# Patient Record
Sex: Male | Born: 1975 | Hispanic: Yes | Smoking: Current every day smoker
Health system: Southern US, Community
[De-identification: ages and names within clinical notes are randomized; demographics above are authoritative.]

## PROBLEM LIST (undated history)

## (undated) HISTORY — PX: ANKLE SURGERY: SHX546

---

## 2010-08-26 ENCOUNTER — Emergency Department: Payer: Self-pay | Admitting: Internal Medicine

## 2010-09-22 ENCOUNTER — Emergency Department: Payer: Self-pay | Admitting: Emergency Medicine

## 2010-12-27 ENCOUNTER — Emergency Department: Payer: Self-pay | Admitting: Emergency Medicine

## 2012-12-13 ENCOUNTER — Emergency Department: Payer: Self-pay | Admitting: Emergency Medicine

## 2013-05-29 ENCOUNTER — Emergency Department: Payer: Self-pay | Admitting: Emergency Medicine

## 2015-02-14 ENCOUNTER — Emergency Department
Admission: EM | Admit: 2015-02-14 | Discharge: 2015-02-14 | Disposition: A | Discharge status: Home / Self Care | Payer: Self-pay | Attending: Student | Admitting: Student

## 2015-02-14 ENCOUNTER — Encounter: Payer: Self-pay | Admitting: Emergency Medicine

## 2015-02-14 DIAGNOSIS — G8929 Other chronic pain: Secondary | ICD-10-CM | POA: Insufficient documentation

## 2015-02-14 DIAGNOSIS — K0889 Other specified disorders of teeth and supporting structures: Secondary | ICD-10-CM

## 2015-02-14 DIAGNOSIS — M25571 Pain in right ankle and joints of right foot: Secondary | ICD-10-CM | POA: Insufficient documentation

## 2015-02-14 DIAGNOSIS — Z720 Tobacco use: Secondary | ICD-10-CM | POA: Insufficient documentation

## 2015-02-14 DIAGNOSIS — K088 Other specified disorders of teeth and supporting structures: Secondary | ICD-10-CM | POA: Insufficient documentation

## 2015-02-14 MED ORDER — TRAMADOL HCL 50 MG PO TABS: 50.0000 mg | ORAL_TABLET | Freq: Four times a day (QID) | ORAL | Status: DC | PRN

## 2015-02-14 MED ORDER — TRAMADOL HCL 50 MG PO TABS
50.0000 mg | ORAL_TABLET | Freq: Once | ORAL | Status: AC
  Administered 2015-02-14: 50 mg via ORAL
  Filled 2015-02-14: qty 1

## 2015-02-14 MED ORDER — AMOXICILLIN 500 MG PO TABS: 500.0000 mg | ORAL_TABLET | Freq: Three times a day (TID) | ORAL | Status: DC

## 2015-02-14 MED ORDER — AMOXICILLIN 500 MG PO CAPS
500.0000 mg | ORAL_CAPSULE | Freq: Once | ORAL | Status: AC
Start: 2015-02-14 — End: 2015-02-14
  Administered 2015-02-14: 500 mg via ORAL
  Filled 2015-02-14: qty 1

## 2015-02-14 MED ORDER — MELOXICAM 15 MG PO TABS: 15.0000 mg | ORAL_TABLET | Freq: Every day | ORAL | Status: DC

## 2015-02-14 NOTE — ED Provider Notes (Signed)
[facility] Emergency Department Provider Note ____________________________________________  Time seen: Approximately 2:04 PM  I have reviewed the triage vital signs and the nursing notes.   HISTORY  Chief Complaint Dental Pain   HPI Erron Wengert is a 39 y.o. male who presents to the emergency department for evaluation of dental pain. Pain has been gradually increasing over the past week. He has been taking ibuprofen without any relief. He is also complaining of chronic right ankle pain that seems to be worsening. He fractured his right ankle several years ago that resulted in reconstructive surgery. He states that when he works long hours and several days in a row ankle is very painful and swollen. He denies new injury.  History reviewed. No pertinent past medical history.  There are no active problems to display for this patient.   Past Surgical History  Procedure Laterality Date  . Ankle surgery Right     Current Outpatient Rx  Name  Route  Sig  Dispense  Refill  . amoxicillin (AMOXIL) 500 MG tablet   Oral   Take 1 tablet (500 mg total) by mouth 3 (three) times daily.   30 tablet   0   . meloxicam (MOBIC) 15 MG tablet   Oral   Take 1 tablet (15 mg total) by mouth daily.   30 tablet   2   . traMADol (ULTRAM) 50 MG tablet   Oral   Take 1 tablet (50 mg total) by mouth every 6 (six) hours as needed.   12 tablet   0     Allergies Review of patient's allergies indicates no known allergies.  No family history on file.  Social History Social History  Substance Use Topics  . Smoking status: Current Every Day Smoker -- 0.50 packs/day    Types: Cigarettes  . Smokeless tobacco: None  . Alcohol Use: None    Review of Systems Constitutional: No fever/chills Eyes: No visual changes. ENT: No sore throat. Cardiovascular: Denies chest pain. Respiratory: Denies shortness of breath. Gastrointestinal: No abdominal pain.  No nausea, no  vomiting.  Genitourinary: Negative for dysuria. Musculoskeletal: Positive for right ankle pain Skin: Negative for rash. Neurological: Negative for headaches, focal weakness or numbness. 10-point ROS otherwise negative.  ____________________________________________   PHYSICAL EXAM:  VITAL SIGNS: ED Triage Vitals  Enc Vitals Group     BP 02/14/15 1326 129/75 mmHg     Pulse Rate 02/14/15 1326 83     Resp 02/14/15 1326 18     Temp 02/14/15 1326 98.6 F (37 C)     Temp Source 02/14/15 1326 Oral     SpO2 02/14/15 1326 98 %     Weight 02/14/15 1326 205 lb (92.987 kg)     Height 02/14/15 1326 6' (1.829 m)     Head Cir --      Peak Flow --      Pain Score 02/14/15 1326 9     Pain Loc --      Pain Edu? --      Excl. in GC? --     Constitutional: Alert and oriented. Well appearing and in no acute distress. Eyes: Conjunctivae are normal. PERRL. EOMI. Head: Atraumatic. Nose: No congestion/rhinnorhea. Mouth/Throat: Mucous membranes are moist.  Oropharynx non-erythematous. Periodontal Exam    Neck: No stridor.  Hematological/Lymphatic/Immunilogical: No cervical lymphadenopathy. Cardiovascular:   Good peripheral circulation. Respiratory: Normal respiratory effort.  No retractions. Musculoskeletal: No lower extremity tenderness nor edema.  No joint effusions. Neurologic:  Normal  speech and language. No gross focal neurologic deficits are appreciated. Speech is normal. No gait instability. Skin:  Skin is warm, dry and intact. No rash noted. Psychiatric: Mood and affect are normal. Speech and behavior are normal.  ____________________________________________   LABS (all labs ordered are listed, but only abnormal results are displayed)  Labs Reviewed - No data to display ____________________________________________   RADIOLOGY  Not indicated ____________________________________________   PROCEDURES  Procedure(s) performed: None  Critical Care performed:  No  ____________________________________________   INITIAL IMPRESSION / ASSESSMENT AND PLAN / ED COURSE  Pertinent labs & imaging results that were available during my care of the patient were reviewed by me and considered in my medical decision making (see chart for details).  Patient was advised to see the dentist within 14 days. Also advised to take the antibiotic until finished. Instructed to return to the ER for symptoms that change or worsen if you are unable to schedule an appointment.  He is also advised to follow-up with the orthopedic doctor for chronic pain. ____________________________________________   FINAL CLINICAL IMPRESSION(S) / ED DIAGNOSES  Final diagnoses:  Pain, dental  Chronic ankle pain, right       Chinita Pester, FNP 02/14/15 1410  Gayla Doss, MD 02/14/15 1529
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
## 2015-02-14 NOTE — Discharge Instructions (Signed)
OPTIONS FOR DENTAL FOLLOW UP CARE ° °[place] Department of Health and Human Services - Local Safety Net Dental Clinics °http://www.ncdhhs.gov/dph/oralhealth/services/safetynetclinics.htm °  °[facility] (336-562-3123) ° °[facility] [place] (919-933-9087) ° °[facility] (919-663-1744 ext [phone]) ° °[facility] ([phone]) ° °[facility] ([phone]) °This clinic caters to the indigent population and is on a lottery system. °Location: °[facility], [address], [place] °Clinic Hours: °Wednesdays from 6pm - 9pm, patients seen by a lottery system. °For dates, call or go to www.med.[facility].edu/[facility]/patients/Dental-[facility] °Services: °Cleanings, fillings and simple extractions. °Payment Options: °DENTAL WORK IS FREE OF CHARGE. Bring proof of income or support. °Best way to get seen: °Arrive at 5:15 pm - this is a lottery, NOT first come/first serve, so arriving earlier will not increase your chances of being seen. °  °  °[facility] °[phone] °Select option 1 for emergencies °  °Location: °[facility], [address], [place] °Clinic Hours: °No walk-ins accepted - call the day before to schedule an appointment. °Check in times are 9:30 am and 1:30 pm. °Services: °Simple extractions, temporary fillings, pulpectomy/pulp debridement, uncomplicated abscess drainage. °Payment Options: °PAYMENT IS DUE AT THE TIME OF SERVICE.  Fee is usually $100-200, additional surgical procedures (e.g. abscess drainage) may be extra. °Cash, checks, Visa/MasterCard accepted.  Can file Medicaid if patient is covered for dental - patient should call case worker to check. °No discount for [facility] patients. °Best way to get seen: °MUST call the day before and get onto the schedule. Can usually be seen the next 1-2 days. No walk-ins accepted. °  °  °[facility] °[phone] °   °Location: °[facility], [address], [place] °Clinic Hours: °M, W, Th, F 8am or 1:30pm, Tues 9a or 1:30 - first come/first served. °Services: °Simple extractions, temporary fillings, uncomplicated abscess drainage.  You do not need to be an [place] resident. °Payment Options: °PAYMENT IS DUE AT THE TIME OF SERVICE. °Dental insurance, otherwise sliding scale - bring proof of income or support. °Depending on income and treatment needed, cost is usually $50-200. °Best way to get seen: °Arrive early as it is first come/first served. °  °  °[facility] °[phone] °  °Location: °[address] °Clinic Hours: °Mon-Thu 8a-5p °Services: °Most basic dental services including extractions and fillings. °Payment Options: °PAYMENT IS DUE AT THE TIME OF SERVICE. °Sliding scale, up to 50% off - bring proof if income or support. °Medicaid with dental option accepted. °Best way to get seen: °Call to schedule an appointment, can usually be seen within 2 weeks OR they will try to see walk-ins - show up at 8a or 2p (you may have to wait). °  °  °[facility] °919-245-2435 °[place] RESIDENTS ONLY °  °Location: °[facility], [address], [place], [place] [address] °Clinic Hours: By appointment only. °Monday - Thursday 8am-5pm, Friday 8am-12pm °Services: Cleanings, fillings, extractions. °Payment Options: °PAYMENT IS DUE AT THE TIME OF SERVICE. °Cash, Visa or MasterCard. Sliding scale - $30 minimum per service. °Best way to get seen: °Come in to office, complete packet and make an appointment - need proof of income °or support monies for each household member and proof of [place] residence. °Usually takes about a month to get in. °  °  °[facility] °[phone] °  °Location: °[address]   [place] °Clinic Hours: Walk-in Urgent Care Dental Services are offered Monday-Friday  mornings only. °The numbers of emergencies accepted daily is limited to the number of °providers available. °Maximum 15 - Mondays, Wednesdays & Thursdays °Maximum 10 - Tuesdays & Fridays °Services: °You do not need to be a [place] resident to be seen for a dental emergency. °Emergencies are defined as pain, swelling, abnormal bleeding, or dental trauma. Walkins will receive x-rays if needed. °NOTE: Dental cleaning is not an emergency. °Payment Options: °PAYMENT IS DUE AT THE TIME OF SERVICE. °Minimum co-pay is $40.00 for uninsured patients. °Minimum co-pay is $3.00 for Medicaid with dental coverage. °Dental Insurance is accepted and must be presented at time of visit. °Medicare does not cover dental. °Forms of payment: Cash, credit card, checks. °Best way to get seen: °If not previously registered with the clinic, walk-in dental registration begins at 7:15 am and is on a first come/first serve basis. °If previously registered with the clinic, call to make an appointment. °  °  °[facility] °[phone] °[place] RESIDENTS ONLY °  °Location: °[address], [place], [place] °Clinic Hours: °Mon-Thu 10a-2p °Services: Extractions only! °Payment Options: °FREE (donations accepted) - bring proof of income or support °Best way to get seen: °Call and schedule an appointment OR come at 8am on the 1st Monday of every month (except for holidays) when it is first come/first served. °  °  °[facility] °[phone] °  °Location: °[address], [place] °Clinic Hours: °Friday mornings °Services, Payment Options, Best way to get seen: °Call for info °

## 2019-02-19 ENCOUNTER — Other Ambulatory Visit: Payer: Self-pay

## 2019-02-19 DIAGNOSIS — Z20822 Contact with and (suspected) exposure to covid-19: Secondary | ICD-10-CM

## 2019-02-20 LAB — NOVEL CORONAVIRUS, NAA: SARS-CoV-2, NAA: NOT DETECTED

## 2020-09-02 ENCOUNTER — Emergency Department: Payer: Self-pay

## 2020-09-02 ENCOUNTER — Other Ambulatory Visit: Payer: Self-pay

## 2020-09-02 ENCOUNTER — Emergency Department
Admission: EM | Admit: 2020-09-02 | Discharge: 2020-09-02 | Disposition: A | Discharge status: Home / Self Care | Payer: Self-pay | Attending: Emergency Medicine | Admitting: Emergency Medicine

## 2020-09-02 DIAGNOSIS — F17210 Nicotine dependence, cigarettes, uncomplicated: Secondary | ICD-10-CM | POA: Insufficient documentation

## 2020-09-02 DIAGNOSIS — S0240FA Zygomatic fracture, left side, initial encounter for closed fracture: Secondary | ICD-10-CM | POA: Insufficient documentation

## 2020-09-02 DIAGNOSIS — F419 Anxiety disorder, unspecified: Secondary | ICD-10-CM | POA: Insufficient documentation

## 2020-09-02 DIAGNOSIS — R519 Headache, unspecified: Secondary | ICD-10-CM | POA: Insufficient documentation

## 2020-09-02 DIAGNOSIS — R0602 Shortness of breath: Secondary | ICD-10-CM | POA: Insufficient documentation

## 2020-09-02 DIAGNOSIS — Z79899 Other long term (current) drug therapy: Secondary | ICD-10-CM | POA: Insufficient documentation

## 2020-09-02 DIAGNOSIS — R197 Diarrhea, unspecified: Secondary | ICD-10-CM | POA: Insufficient documentation

## 2020-09-02 DIAGNOSIS — Z20822 Contact with and (suspected) exposure to covid-19: Secondary | ICD-10-CM | POA: Insufficient documentation

## 2020-09-02 LAB — CBC WITH DIFFERENTIAL/PLATELET
Abs Immature Granulocytes: 0.05 10*3/uL (ref 0.00–0.07)
Basophils Absolute: 0.1 10*3/uL (ref 0.0–0.1)
Basophils Relative: 1 %
Eosinophils Absolute: 0.5 10*3/uL (ref 0.0–0.5)
Eosinophils Relative: 4 %
HCT: 40 % (ref 39.0–52.0)
Hemoglobin: 13.7 g/dL (ref 13.0–17.0)
Immature Granulocytes: 1 %
Lymphocytes Relative: 38 %
Lymphs Abs: 4.2 10*3/uL — ABNORMAL HIGH (ref 0.7–4.0)
MCH: 32 pg (ref 26.0–34.0)
MCHC: 34.3 g/dL (ref 30.0–36.0)
MCV: 93.5 fL (ref 80.0–100.0)
Monocytes Absolute: 0.8 10*3/uL (ref 0.1–1.0)
Monocytes Relative: 8 %
Neutro Abs: 5.2 10*3/uL (ref 1.7–7.7)
Neutrophils Relative %: 48 %
Platelets: 228 10*3/uL (ref 150–400)
RBC: 4.28 MIL/uL (ref 4.22–5.81)
RDW: 13.3 % (ref 11.5–15.5)
Smear Review: NORMAL
WBC: 10.8 10*3/uL — ABNORMAL HIGH (ref 4.0–10.5)
nRBC: 0 % (ref 0.0–0.2)

## 2020-09-02 LAB — COMPREHENSIVE METABOLIC PANEL
ALT: 80 U/L — ABNORMAL HIGH (ref 0–44)
AST: 55 U/L — ABNORMAL HIGH (ref 15–41)
Albumin: 4.3 g/dL (ref 3.5–5.0)
Alkaline Phosphatase: 55 U/L (ref 38–126)
Anion gap: 9 (ref 5–15)
BUN: 18 mg/dL (ref 6–20)
CO2: 22 mmol/L (ref 22–32)
Calcium: 8.9 mg/dL (ref 8.9–10.3)
Chloride: 106 mmol/L (ref 98–111)
Creatinine, Ser: 0.93 mg/dL (ref 0.61–1.24)
GFR, Estimated: >60 mL/min (ref >60)
Glucose, Bld: 116 mg/dL — ABNORMAL HIGH (ref 70–99)
Potassium: 3.8 mmol/L (ref 3.5–5.1)
Sodium: 137 mmol/L (ref 135–145)
Total Bilirubin: 0.8 mg/dL (ref 0.3–1.2)
Total Protein: 7.5 g/dL (ref 6.5–8.1)

## 2020-09-02 LAB — RESP PANEL BY RT-PCR (FLU A&B, COVID) ARPGX2
Influenza A by PCR: NEGATIVE
Influenza B by PCR: NEGATIVE
SARS Coronavirus 2 by RT PCR: NEGATIVE

## 2020-09-02 LAB — TROPONIN I (HIGH SENSITIVITY): Troponin I (High Sensitivity): 4 ng/L (ref <18)

## 2020-09-02 MED ORDER — HYDROCODONE-ACETAMINOPHEN 5-325 MG PO TABS: 2.0000 | ORAL_TABLET | Freq: Four times a day (QID) | ORAL | 0 refills | Status: AC | PRN

## 2020-09-02 MED ORDER — HYDROCODONE-ACETAMINOPHEN 5-325 MG PO TABS
1.0000 | ORAL_TABLET | Freq: Once | ORAL | Status: AC
Start: 2020-09-02 — End: 2020-09-02
  Administered 2020-09-02: 1 via ORAL
  Filled 2020-09-02: qty 1

## 2020-09-02 NOTE — ED Notes (Signed)
Patient allowed this writer to obtained blood work.
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
